# Patient Record
Sex: Female | Born: 1945 | Race: White | Hispanic: No | Smoking: Never smoker
Health system: Southern US, Community
[De-identification: ages and names within clinical notes are randomized; demographics above are authoritative.]

## PROBLEM LIST (undated history)

## (undated) HISTORY — PX: APPENDECTOMY: SHX54

---

## 2018-03-07 ENCOUNTER — Ambulatory Visit
Admission: EM | Admit: 2018-03-07 | Discharge: 2018-03-07 | Disposition: A | Discharge status: Home / Self Care | Payer: Medicare Other | Attending: Family Medicine | Admitting: Family Medicine

## 2018-03-07 ENCOUNTER — Ambulatory Visit (INDEPENDENT_AMBULATORY_CARE_PROVIDER_SITE_OTHER): Payer: Medicare Other

## 2018-03-07 DIAGNOSIS — M25461 Effusion, right knee: Secondary | ICD-10-CM | POA: Diagnosis not present

## 2018-03-07 DIAGNOSIS — W5512XA Struck by horse, initial encounter: Secondary | ICD-10-CM

## 2018-03-07 DIAGNOSIS — S8001XA Contusion of right knee, initial encounter: Secondary | ICD-10-CM

## 2018-03-07 MED ORDER — MELOXICAM 7.5 MG PO TABS: 7.5000 mg | ORAL_TABLET | Freq: Every day | ORAL | 0 refills | Status: AC

## 2018-03-07 NOTE — ED Triage Notes (Signed)
Pt states yesterday about 6pm and was kicked in her right leg by her miniature horse. Did take some ibuprofen, did wrap it with ace wrap and ice but still having pain in it. Wants to rule out a break.

## 2018-03-07 NOTE — Discharge Instructions (Addendum)
Take medication as prescribed. Rest. Drink plenty of fluids.  Ice and elevate.  Gradual increase activity as tolerated.  Follow-up with orthopedic this week as needed for continued pain.  Follow up with your primary care physician this week as needed. Return to Urgent care for new or worsening concerns.

## 2018-03-07 NOTE — ED Provider Notes (Signed)
[facility] ____________________________________________  Time seen: Approximately 12:33 PM  I have reviewed the triage vital signs and the nursing notes.   HISTORY  Chief Complaint Leg Pain   HPI Christy Hines is a 73 y.o. female presenting for evaluation of right knee pain after injury that occurred at approximately 6 PM last night.  States that her miniature horse became excited and kicked her in the side of her right leg.  States pain since.  Reports able to still weight-bear and walk but with some pain.  States knee feels tight.  Pain currently mild, moderate with certain activity.  Has continue to remain active.  Did take over-the-counter ibuprofen which helps.  Did also apply some ice.  Knee is swollen.  Denies fall or other injuries.  Denies pain radiation, paresthesias, decreased range of motion, chest pain, shortness of breath or other complaints.  Reports otherwise doing well.  Denies renal insufficiency.Denies cardiac history.   History reviewed. No pertinent past medical history.  There are no active problems to display for this patient.   Past Surgical History:  Procedure Laterality Date  . APPENDECTOMY       No current facility-administered medications for this encounter.   Current Outpatient Medications:  .  meloxicam (MOBIC) 7.5 MG tablet, Take 1 tablet (7.5 mg total) by mouth daily., Disp: 10 tablet, Rfl: 0  Allergies Meperidine and Sulfa antibiotics  Family History  Problem Relation Age of Onset  . Diabetes Mother   . Heart failure Mother   . Cirrhosis Father     Social History Social History   Tobacco Use  . Smoking status: Never Smoker  . Smokeless tobacco: Never Used  Substance Use Topics  . Alcohol use: Yes  . Drug use: Not on file    Review of Systems Constitutional: No fever Cardiovascular: Denies chest pain. Respiratory: Denies shortness of breath. Gastrointestinal: No abdominal pain. Musculoskeletal: Negative for back  pain.  Positive right leg pain. Skin: Negative for rash.  ____________________________________________   PHYSICAL EXAM:  VITAL SIGNS: ED Triage Vitals  Enc Vitals Group     BP 03/07/18 1116 103/70     Pulse Rate 03/07/18 1116 78     Resp 03/07/18 1116 18     Temp 03/07/18 1116 97.8 F (36.6 C)     Temp Source 03/07/18 1116 Oral     SpO2 03/07/18 1116 98 %     Weight 03/07/18 1118 123 lb (55.8 kg)     Height 03/07/18 1118 5\' 2"  (1.575 m)     Head Circumference --      Peak Flow --      Pain Score 03/07/18 1118 6     Pain Loc --      Pain Edu? --      Excl. in GC? --     Constitutional: Alert and oriented. Well appearing and in no acute distress. ENT      Head: Normocephalic and atraumatic. Cardiovascular: Normal rate, regular rhythm. Grossly normal heart sounds.  Good peripheral circulation. Respiratory: Normal respiratory effort without tachypnea nor retractions. Breath sounds are clear and equal bilaterally. No wheezes, rales, rhonchi. Musculoskeletal:Bilateral pedal pulses equal and easily palpated.  Ambulatory with mild antalgic gait. Except: Right knee with mild effusion present, particularly right anterior lateral, right anterior lateral mild to moderate tenderness to direct palpation, no point bony tenderness, no pain with anterior posterior drawer test, no medial or lateral stress pain, knee appears stable, full range of motion present, mild pain with  resisted extension, no pain with resisted flexion.  Right lower extremity otherwise nontender. Neurologic:  Normal speech and language. Speech is normal. No gait instability.  Skin:  Skin is warm, dry and intact. No rash noted. Psychiatric: Mood and affect are normal. Speech and behavior are normal. Patient exhibits appropriate insight and judgment   ___________________________________________   LABS (all labs ordered are listed, but only abnormal results are displayed)   RADIOLOGY  Dg Knee Complete 4 Views  Right  Result Date: 03/07/2018 CLINICAL DATA:  [age] year old female with diffuse pain and swelling of the right knee after being kicked EXAM: RIGHT KNEE - COMPLETE 4+ VIEW COMPARISON:  None. FINDINGS: No evidence of acute fracture or malalignment. There is a small suprapatellar knee joint effusion. Normal bony mineralization. No lytic or blastic osseous lesion. IMPRESSION: Small knee joint effusion. No evidence of acute fracture or malalignment. Electronically Signed   By: Malachy MoanHeath  McCullough M.D.   On: 03/07/2018 13:01   ____________________________________________   PROCEDURES Procedures   INITIAL IMPRESSION / ASSESSMENT AND PLAN / ED COURSE  Pertinent labs & imaging results that were available during my care of the patient were reviewed by me and considered in my medical decision making (see chart for details).  Well-appearing patient.  No acute distress.  Right knee pain post mechanical injury that occurred last night.  Suspect contusion and effusion injury.  Right knee x-ray as above per radiologist, no acute osseous abnormality, small effusion noted.  Continue supportive care, ice, elevation, Ace wrap.  Patient declines need for crutches or walker.  Will treat with oral daily Mobic.  Follow-up with orthopedic this week as needed for continued pain.  Patient declined any further pain prescription medications.Discussed indication, risks and benefits of medications with patient.  Discussed follow up with Primary care physician this week. Discussed follow up and return parameters including no resolution or any worsening concerns. Patient verbalized understanding and agreed to plan.   ____________________________________________   FINAL CLINICAL IMPRESSION(S) / ED DIAGNOSES  Final diagnoses:  Contusion of right knee, initial encounter  Effusion of right knee joint     ED Discharge Orders         Ordered    meloxicam (MOBIC) 7.5 MG tablet  Daily     03/07/18 1319           Note:  This dictation was prepared with Dragon dictation along with smaller phrase technology. Any transcriptional errors that result from this process are unintentional.         Renford DillsMiller, Annalynne Ibanez, NP 03/07/18 1325

## 2018-05-08 ENCOUNTER — Ambulatory Visit: Payer: Self-pay | Admitting: Internal Medicine

## 2020-01-26 IMAGING — CR DG KNEE COMPLETE 4+V*R*
4 series · 4 of 4 positions shown · non-contrast
Comparison: None.

CLINICAL DATA: 72-year-old female with diffuse pain and swelling of
the right knee after being kicked

EXAM:
RIGHT KNEE - COMPLETE 4+ VIEW

[knee ap]
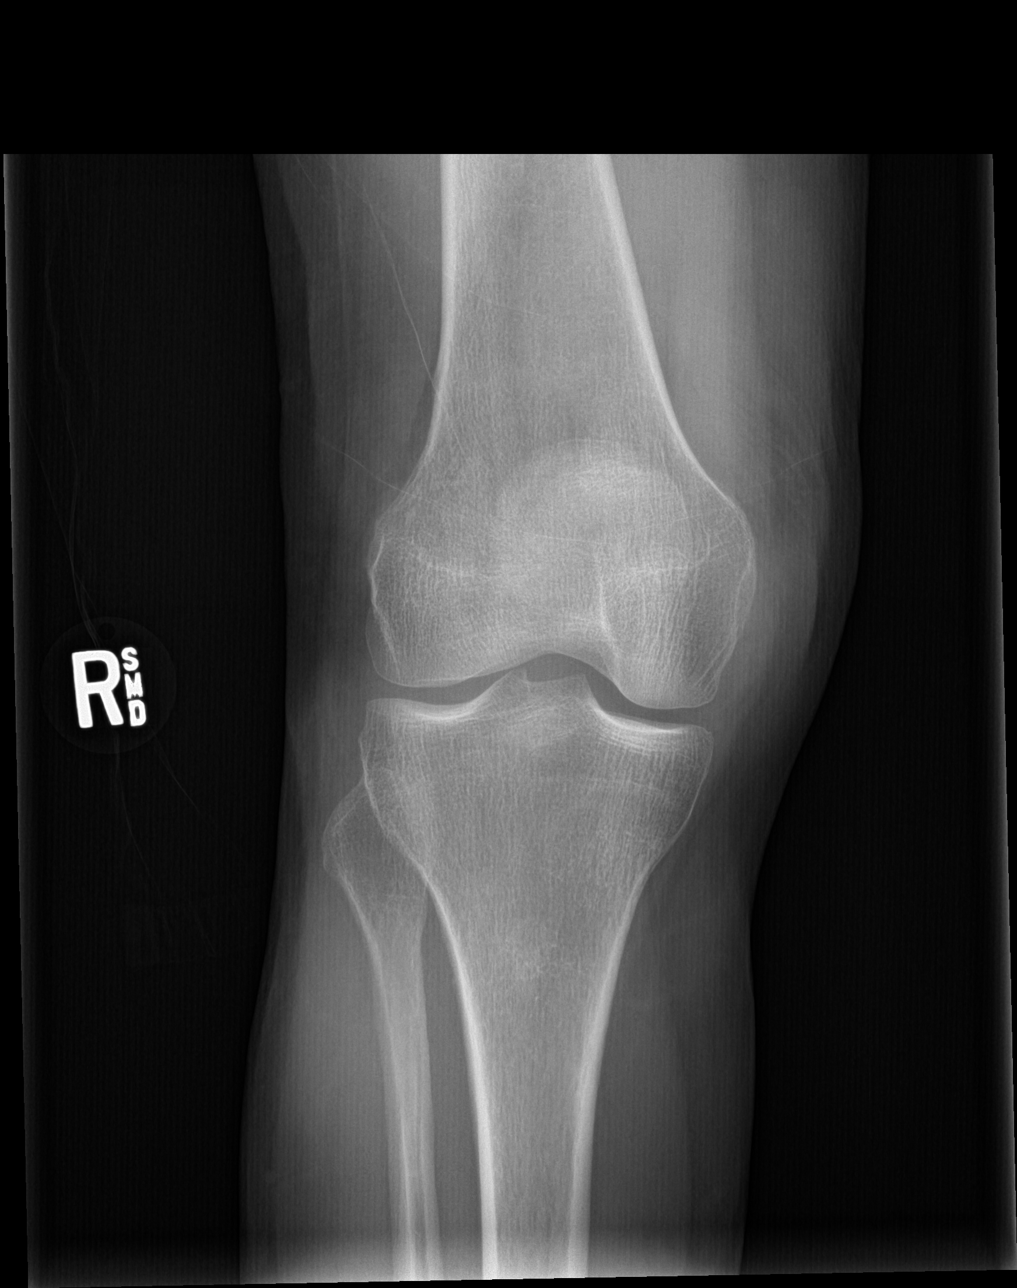

[knee lat]
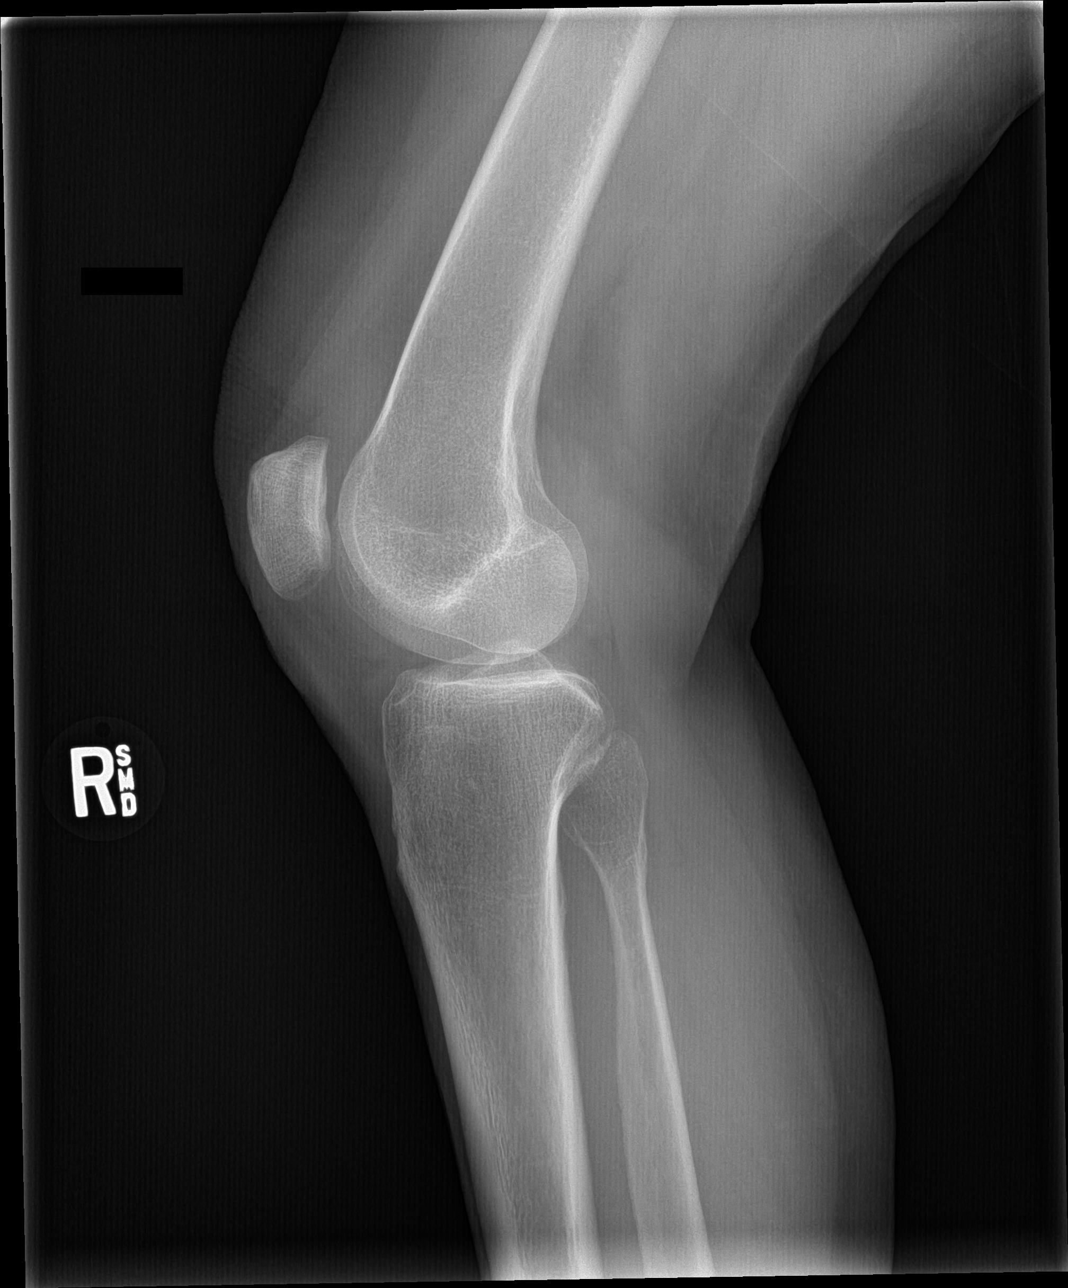

[tunnel]
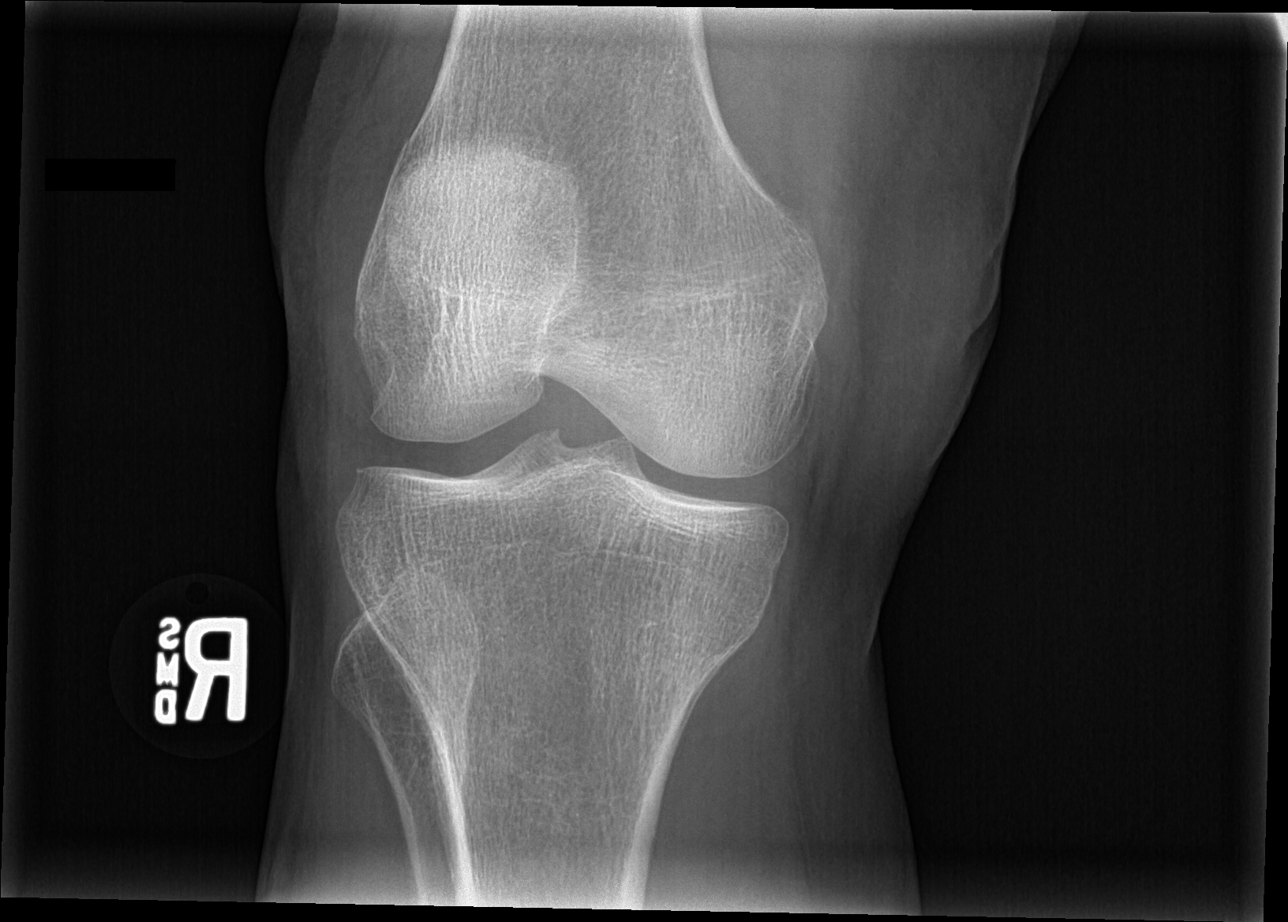

[patella skyline]
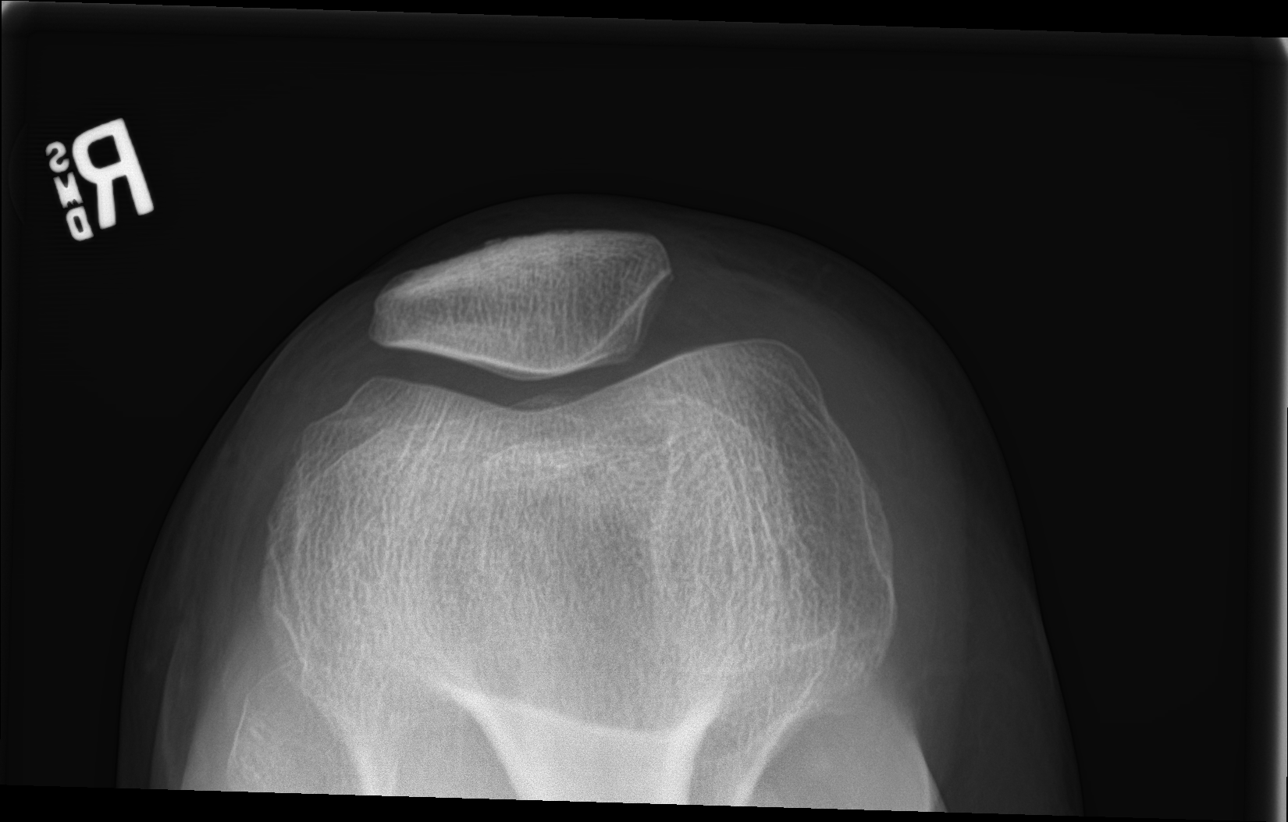

[4 of 4 positions shown; findings below may reference images not displayed]

FINDINGS: No evidence of acute fracture or malalignment. There is a small
suprapatellar knee joint effusion. Normal bony mineralization. No
lytic or blastic osseous lesion.
IMPRESSION: Small knee joint effusion. No evidence of acute fracture or
malalignment.
